# Patient Record
Sex: Male | Born: 1970 | Race: White | Hispanic: No | Marital: Married | Smoking: Never smoker
Health system: Southern US, Community
[De-identification: ages and names within clinical notes are randomized; demographics above are authoritative.]

## PROBLEM LIST (undated history)

## (undated) DIAGNOSIS — G4730 Sleep apnea, unspecified: Secondary | ICD-10-CM

## (undated) DIAGNOSIS — I10 Essential (primary) hypertension: Secondary | ICD-10-CM

## (undated) HISTORY — PX: ROTATOR CUFF REPAIR: SHX139

---

## 2006-07-04 ENCOUNTER — Ambulatory Visit: Payer: Self-pay | Admitting: Pulmonary Disease

## 2006-07-27 ENCOUNTER — Ambulatory Visit (HOSPITAL_BASED_OUTPATIENT_CLINIC_OR_DEPARTMENT_OTHER): Admission: RE | Admit: 2006-07-27 | Discharge: 2006-07-27 | Payer: Self-pay | Admitting: Pulmonary Disease

## 2006-08-02 ENCOUNTER — Ambulatory Visit: Payer: Self-pay | Admitting: Pulmonary Disease

## 2006-08-03 ENCOUNTER — Observation Stay (HOSPITAL_COMMUNITY): Admission: AD | Admit: 2006-08-03 | Discharge: 2006-08-04 | Payer: Self-pay | Admitting: Specialist

## 2007-12-13 ENCOUNTER — Ambulatory Visit (HOSPITAL_BASED_OUTPATIENT_CLINIC_OR_DEPARTMENT_OTHER): Admission: RE | Admit: 2007-12-13 | Discharge: 2007-12-13 | Payer: Self-pay | Admitting: Specialist

## 2010-10-26 NOTE — Op Note (Signed)
[name], [name]           ACCOUNT NO.:  [id number]   MEDICAL RECORD NO.:  [id number]          PATIENT TYPE:  AMB   LOCATION:  [facility]                         FACILITY:  [facility]   PHYSICIAN:  Erasmo Leventhal, M.D.DATE OF BIRTH:  11/05/1970   DATE OF PROCEDURE:  12/13/2007  DATE OF DISCHARGE:                               OPERATIVE REPORT   PREOPERATIVE DIAGNOSES:  1. 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  2. AC arthritis.   POSTOPERATIVE DIAGNOSES:  1. 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  2. AC arthritis.   PROCEDURE:  1. Left shoulder using glenohumeral diagnostic arthroscopy.  2. Arthroscopic subacromial decompression with acromioplasty,      bursectomy, and CA ligament release.  3. Arthroscopic distal clavicle resection,. Mumford procedure.  4. Arthroscopic rotator cuff repair.   SURGEON:  Erasmo Leventhal, M.D.   ASSISTANT:  Gary Bitter. [name], P.A.-C.   ANESTHESIA:  Interscalene block, general.   ESTIMATED BLOOD LOSS:  Less than 10 mL.   DRAINS:  None.   COMPLICATIONS:  None.   DISPOSITION:  PACU stable.   DETAILS:  The patient and family counseled in the holding area.  Taken  to the operating room and placed in position, general anesthesia, IV  antibiotics were given.  Turned to the right lateral decubitus position,  properly padded, bumped.  Left shoulder was examined.  Full range of  motion stable.  He was prepped with DuraPrep and draped in a sterile  fashion.  Overhead shoulder positioner was utilized at 30 degrees  abduction, 10 degrees of forward flexion, and 15 pounds of longitudinal  traction.  Posterior portal was created, and an arthroscope was placed  into the glenohumeral joint.  Diagnostic arthroscopy revealed a normal  glenohumeral anatomy except for a small rotator cuff tear, at the  supraspinatus insertion site.  Biceps labrum, and glenohumeral ligaments  noted in the capsule, and articular  cartilage was normal.   Arthroscope was placed in the subacromial region.  He was found to have  his rotator cuff tear, actually a small-to-medium sized tear around 1.5  cm.  The cuff was not significantly retracted, it was lightly mobilized.  Repair site was prepared down to bone.  The ArthroCare system was  utilized to introduce our lateral portal.  An ArthroCare system was  utilized to release the periosteum and the CA ligament.   A shaver was introduced into the subacromial bursectomy.  A burr was  then placed posteriorly, and an anterior-inferior acromioplasty was  performed converting to a flat acromion morphology.  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  The clavicle was palpated, and found to be stable.  Arthroscopic debris  was removed.  Hemostasis obtained.   Through a separate puncture wound, a bioabsorbable anchor was placed at  the appropriate level.  Mattress sutures were placed, tied down  sequentially, and these were then placed into a push-loc anchor for a  double row technique.  Now  had a well repaired rotator cuff, back to its  anatomic insertion site, cupping the rotator cuff footprint,  reestablishing the musculotendinous unit.  It was irrigated, and no  other abnormalities noted.  Arthroscope was removed, and taken out of  traction.  He had normal pulses in the hand at the end of the case.  Another gram of Ancef was given intravenously.  He was turned supine,  after closure of the wounds with nylon, he was placed in a sterile  dressing, placed into a shoulder sling, taken from the operating room to  PACU in stable condition.  There were no complications or problems.  Sponge and needle counts were correct.  Help with surgical technique and  decision making; Leilani Able  PA-C assistance was needed throughout the  entire case.           ______________________________  Erasmo Leventhal, M.D.     RAC/MEDQ  D:  12/13/2007  T:  12/13/2007  Job:  [id number]

## 2010-10-29 NOTE — Assessment & Plan Note (Signed)
[facility]                             PULMONARY OFFICE NOTE   NAME:Gary [name], Gary [name]                    MRN:          [id number]  DATE:07/04/2006                            DOB:          07-Feb-1971    SLEEP MEDICINE CONSULTATION   HISTORY OF PRESENT ILLNESS:  The patient is a 40 year old gentleman who  I have been asked to see for obstructive sleep apnea.  The patient has a  history of obstructive sleep apnea and is to undergo shoulder surgery.  The patient states that he was told that his sleep apnea was severe from  the past and underwent a CPAP titration study.  He got his machine and  had great difficulties with sleep onset and pulling off the mask during  the night.  He wore it for about 1 week and then discontinued.  The  patient states that he goes to bed between 10 and 11 and gets up between  4 and 5 to start his day.  He is not sure whether he is rested or not  upon arising.  He has been told that he has loud snoring but unsure if  he has pauses or not.  The patient notes sleep pressure during the day  with meetings and periods of inactivity.  He has occasional sleep  pressure with driving.  He may doze with TV or movies if they are not  very interesting.  He feels that his sleep problem is a little bit  better from what it has been in the past.  His weight is unchanged from  2 years ago.   PAST MEDICAL HISTORY:  Significant for:  1. Allergic rhinitis.  2. History of sleep apnea as stated above.  3. History of rotator cuff injury.   CURRENT MEDICATIONS:  None.   The patient INTOLERANCE TO CODEINE.   SOCIAL HISTORY:  He is married.  He works as an Secondary school teacher person.  He has never smoked.   FAMILY HISTORY:  Remarkable for his father having emphysema.   REVIEW OF SYSTEMS:  As per history of present illness.  Also see patient  intake form documented in the chart.   PHYSICAL EXAM:  GENERAL:  He is a mildly  overweight male in no acute  distress.  Blood pressure is 138/104.  Pulse 77.  Temperature is 98.6.  Weight is  178 pounds. O2 saturation on room air is 99%.  Five feet, 10 inches  tall.  HEENT:  Pupils equal round and reactive to light and accommodation.  Extraocular muscles are intact.  Nares shows deviated septum to the  right with near occlusion.  There are increased turbinates on the left.  Oropharynx does show elongation of soft palate and uvula.  NECK:  Supple without JVD or lymphadenopathy.  There is no palpable  thyromegaly.  CHEST:  Totally clear.  CARDIAC:  Regular rate and rhythm.  No murmurs, rubs, or gallops.  ABDOMEN:  Soft, nontender with good bowel sounds.  GENITAL, RECTAL, BREASTS:  Not done and not indicated.  Lower extremities are without edema. Good pulses distally.  There is no  calf tenderness.  NEUROLOGIC:  Alert and oriented with no obvious motor deficits.   IMPRESSION:  History of severe obstructive sleep apnea.  The patient  currently is on no treatment at this time.  Overall, he feels that he is  doing well but his weight has not changed.  He clearly is having daytime  symptomatology and I have explained to him that it is very unlikely that  the degree of his sleep apnea has changed very much without significant  weight loss.  I discussed with him the cardiovascular effects of sleep  apnea overtime and that we need to get him on appropriate treatment.   PLAN:  1. Work on weight loss.  2. We will go ahead and set the patient up from a split night study to      relook at his degree of sleep apnea and also to optimize his CPAP      pressure.  3. The patient will follow up after the above.     Barbaraann Share, MD,FCCP  Electronically Signed    [facility]/MedQ  DD: 08/08/2006  DT: 08/08/2006  Job #: [id number]   cc:   Erasmo Leventhal, M.D.

## 2010-10-29 NOTE — Procedures (Signed)
NAMEYESHUA, STRYKER           ACCOUNT NO.:  [id number]   MEDICAL RECORD NO.:  [id number]          PATIENT TYPE:  OUT   LOCATION:  [facility]                 FACILITY:  [facility]   PHYSICIAN:  Barbaraann Share, MD,FCCPDATE OF BIRTH:  03[birth year]16[birth year]1971   DATE OF STUDY:  07/27/2006                            NOCTURNAL POLYSOMNOGRAM   INDICATION FOR THE STUDY:  Hypersomnia with sleep apnea.   EPWORTH SCORE:  Ten.   SLEEP ARCHITECTURE:  The patient had a total sleep time of 367 minutes  with adequate slow wave sleep as well as REM.  Sleep onset latency was  normal as was REM onset.  Sleep efficiency was fairly good at 94%.   RESPIRATORY DATA:  The patient underwent split-night protocol where he  was found to have 59 obstructive events in the first 133 minutes of  sleep.  This gave the patient a respiratory disturbance index of 27  events per hour over that time period.  The events were more common in  the supine position, and there was snoring noted throughout.  The  patient was then placed on CPAP with a medium snapp mask.  CPAP pressure  was ultimately titrated to a final level of 9=cmH2O with excellent  control of both obstructive events and snoring.   OXYGEN DATA:  His O2 sat was as low as 84% with the patient's  obstructive events.   CARDIAC DATA:  No clinically significant cardiac arrhythmias.   MOVEMENT/PARASOMNIA:  None.   IMPRESSION/RECOMMENDATION:  Split-night study reveals moderate  obstructive sleep apnea with a respiratory disturbance index of 27  events per hour and oxygen desaturation as low as 84% during the first  half of the night.  The patient was then placed on a medium snapp mask  and ultimately titrated to 9-cmH2O with excellent control.      Barbaraann Share, MD,FCCP  Diplomate, American Board of Sleep  Medicine  Electronically Signed     [facility]/MEDQ  D:  08/03/2006 15:44:16  T:  08/03/2006 22:06:44  Job:  [id number]

## 2010-10-29 NOTE — Op Note (Signed)
NAMESANTE, BIEDERMANN           ACCOUNT NO.:  [id number]   MEDICAL RECORD NO.:  [id number]          PATIENT TYPE:  AMB   LOCATION:  [facility]                         FACILITY:  [facility]   PHYSICIAN:  Erasmo Leventhal, M.D.DATE OF BIRTH:  02-22-1971   DATE OF PROCEDURE:  08/03/2006  DATE OF DISCHARGE:                               OPERATIVE REPORT   PREOPERATIVE DIAGNOSES:  1. Right shoulder impingement syndrome.  2. Possible cuff tear.  3. Symptomatic degenerative acromioclavicular joint.   POSTOPERATIVE DIAGNOSES:  1. 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  2. Full-thickness rotator cuff tear.  3. Symptomatic degenerative acromioclavicular joint.   PROCEDURE:  1. Right shoulder exam under anesthesia.  2. Arthroscopic evaluation of the glenohumeral joint.  3. Arthroscopic subacromial decompression.  4. Arthroscopic distal clavicle resection.  5. Mumford procedure.  6. Arthroscopic rotator cuff repair.   SURGEON:  Erasmo Leventhal, M.D.   ASSISTANT:  Jaquelyn Bitter. Chabon, P.A.-C   ANESTHESIA:  Regional with general, interscalene block.   ESTIMATED BLOOD LOSS:  Less then 10 mL   DRAINS:  None.   COMPLICATIONS:  None.   DISPOSITION:  PACU stable.   OPERATIVE DETAILS:  The patient's family was counseled in the holding  area.  Correct side was identified.  IV had been started.  Antibiotics  were given and block administered.  He was then taken to the operating  room and placed in the supine position under general anesthesia.  He was  turned to the left lateral decubitus position, properly padded and  bumped.  Right shoulder had been examined, full range of motion, stable;  prepped with DuraPrep, and draped in a stable fashion.   Posterior portal was created and an arthroscope placed into the  glenohumeral joint.  Diagnostic arthroscopy revealed the following  findings.  Labrum and biceps intact.  Articular cartilage, glenohumeral  ligament is  normal.  There was a full-thickness, rotator cuff tear seen  at the supraspinatus insertion.  Arthroscope now placed into the  subacromial region where a subacromial bursectomy was performed from a  lateral portal.  The rotator cuff was found to be nonretracted.  The  rotator cuff repair site was repaired down to bleeding bone.  The  ArthroCare system was then utilized to release the periosteum and CA  ligament.  A bur was then placed posteriorly and an anterior/inferior  acromioplasty was performed converting to a type-1 acromion morphology.   The distal clavicle was found to be osteoarthritic and an accessory  anterior portal was made and the lateral 5-8 mm of the clavicle was  removed circumferentially leaving the superior and posterior  acromioclavicular ligaments and capsule intact and the clavicle was  palpated and found to be stable.  Arthroscopic debris was removed.  Another small puncture wound was made and Arthrex bioabsorbable anchor  was placed at the appropriate level.  Mattress sutures were placed, tied  down sequentially and then utilizing a push-lock anchor a double-row-  type technique, common suture, bridge technique was performed giving  excellent repair to the supraspinatus tendon to its anatomic insertion  site in a nice bleeding bed of  bone.  We had a nice sturdy repair,  irrigated, no other abnormalities noted.  The arthroscopic equipment was  removed.  He was taken out of traction and he had known pulses in the  hand at the end of the case.  The portals were closed with 4-0 nylon  suture.  Then 20 mL of 0.25% Marcaine with epinephrine placed in the  shoulder joint after this.  Sterile dressing was applied; turned supine,  and awakened.  Taken from the operating room in stable condition.  Another gram of Ancef was given intravenously postoperatively.  The  patient tolerated the procedure well.  There were no complications.  The  patient was taken from the operating  room in stable condition.   To help with surgical technique and decision making, Jaquelyn Bitter. Chabon,  P.A.-C's assistance was needed throughout the entire case.           ______________________________  Erasmo Leventhal, M.D.     RAC/MEDQ  D:  08/03/2006  T:  08/03/2006  Job:  [id number]

## 2011-03-10 LAB — POCT HEMOGLOBIN-HEMACUE: Hemoglobin: 16.3

## 2014-03-06 ENCOUNTER — Encounter (HOSPITAL_COMMUNITY): Payer: Self-pay | Admitting: Emergency Medicine

## 2014-03-06 ENCOUNTER — Emergency Department (HOSPITAL_COMMUNITY): Payer: BC Managed Care – PPO

## 2014-03-06 ENCOUNTER — Emergency Department (HOSPITAL_COMMUNITY)
Admission: EM | Admit: 2014-03-06 | Discharge: 2014-03-06 | Disposition: A | Discharge status: Home / Self Care | Payer: BC Managed Care – PPO | Attending: Emergency Medicine | Admitting: Emergency Medicine

## 2014-03-06 DIAGNOSIS — S61209A Unspecified open wound of unspecified finger without damage to nail, initial encounter: Secondary | ICD-10-CM | POA: Insufficient documentation

## 2014-03-06 DIAGNOSIS — Y9389 Activity, other specified: Secondary | ICD-10-CM | POA: Insufficient documentation

## 2014-03-06 DIAGNOSIS — S6990XA Unspecified injury of unspecified wrist, hand and finger(s), initial encounter: Secondary | ICD-10-CM | POA: Insufficient documentation

## 2014-03-06 DIAGNOSIS — S6980XA Other specified injuries of unspecified wrist, hand and finger(s), initial encounter: Secondary | ICD-10-CM | POA: Insufficient documentation

## 2014-03-06 DIAGNOSIS — W208XXA Other cause of strike by thrown, projected or falling object, initial encounter: Secondary | ICD-10-CM | POA: Insufficient documentation

## 2014-03-06 DIAGNOSIS — Y990 Civilian activity done for income or pay: Secondary | ICD-10-CM | POA: Insufficient documentation

## 2014-03-06 DIAGNOSIS — S61311A Laceration without foreign body of left index finger with damage to nail, initial encounter: Secondary | ICD-10-CM

## 2014-03-06 DIAGNOSIS — I10 Essential (primary) hypertension: Secondary | ICD-10-CM | POA: Diagnosis not present

## 2014-03-06 DIAGNOSIS — Y9289 Other specified places as the place of occurrence of the external cause: Secondary | ICD-10-CM | POA: Insufficient documentation

## 2014-03-06 DIAGNOSIS — Z79899 Other long term (current) drug therapy: Secondary | ICD-10-CM | POA: Insufficient documentation

## 2014-03-06 HISTORY — DX: Essential (primary) hypertension: I10

## 2014-03-06 MED ORDER — LIDOCAINE-EPINEPHRINE-TETRACAINE (LET) SOLUTION
3.0000 mL | Freq: Once | NASAL | Status: AC
  Administered 2014-03-06: 3 mL via TOPICAL
  Filled 2014-03-06: qty 3

## 2014-03-06 MED ORDER — IBUPROFEN 800 MG PO TABS: 800.0000 mg | ORAL_TABLET | Freq: Three times a day (TID) | ORAL | Status: DC

## 2014-03-06 MED ORDER — IBUPROFEN 400 MG PO TABS
800.0000 mg | ORAL_TABLET | Freq: Once | ORAL | Status: AC
  Administered 2014-03-06: 800 mg via ORAL
  Filled 2014-03-06: qty 2

## 2014-03-06 NOTE — ED Provider Notes (Signed)
CSN: [id number]     Arrival date & time 03/06/14  1211 History  This chart was scribed for non-physician practitioner, Fayrene Helper, PA-C working with Merrie Roof, MD by Greggory Stallion, ED scribe. This patient was seen in room TR07C/TR07C and the patient's care was started at 1:36 PM.   Chief Complaint  Patient presents with  . Finger Injury   The history is provided by the patient. No language interpreter was used.   HPI Comments: Gary [name] is a 43 y.o. male who presents to the Emergency Department complaining of left index finger injury that occurred at 11 AM today. States he was at work when a heavy pipe fell on it. Reports sudden onset pain with associated swelling and laceration. Denies numbness. Pt is right hand dominant. Pt thinks his last tetanus was is 2008. No numbness.  No other complaint  Past Medical History  Diagnosis Date  . Hypertension    Past Surgical History  Procedure Laterality Date  . Rotator cuff repair     No family history on file. History  Substance Use Topics  . Smoking status: Never Smoker   . Smokeless tobacco: Not on file  . Alcohol Use: Yes     Comment: occassionally    Review of Systems  Musculoskeletal: Positive for arthralgias and joint swelling.  Skin: Positive for wound.  Neurological: Negative for numbness.  All other systems reviewed and are negative.  Allergies  Codeine  Home Medications   Prior to Admission medications   Medication Sig Start Date End Date Taking? Authorizing Provider  BYSTOLIC 5 MG tablet Take 5 mg by mouth daily. 02/23/14  Yes Historical Provider, MD  omeprazole (PRILOSEC) 40 MG capsule Take 40 mg by mouth daily. 02/23/14  Yes Historical Provider, MD   BP 165/104  Pulse 76  Temp(Src) 98.2 F (36.8 C) (Oral)  Resp 18  Ht  (1.778 m)  Wt 197 lb (89.359 kg)  BMI 28.27 kg/m2  SpO2 97%  Physical Exam  Nursing note and vitals reviewed. Constitutional: He is oriented to person, place, and  time. He appears well-developed and well-nourished. No distress.  HENT:  Head: Normocephalic and atraumatic.  Eyes: Conjunctivae and EOM are normal.  Neck: Neck supple. No tracheal deviation present.  Cardiovascular: Normal rate.   Pulmonary/Chest: Effort normal. No respiratory distress.  Musculoskeletal: Normal range of motion. He exhibits edema.  Left index finger with an almost circumferential superficial laceration to proximal phalanx measuring about about 3 cm. Not actively bleeding. Edematous finger. No crepitus or obvious deformity. Capillary refill less than 3 seconds.   Neurological: He is alert and oriented to person, place, and time.  Skin: Skin is warm and dry.  Psychiatric: He has a normal mood and affect. His behavior is normal.    ED Course  Procedures (including critical care time)  LACERATION REPAIR PROCEDURE NOTE The patient's identification was confirmed and consent was obtained. This procedure was performed by Fayrene Helper, PA-C at 3:13 PM. Site: left index finger Sterile procedures observed Anesthetic used (type and amt): none Suture type/size: dermabond Length: 3 cm  # of Sutures: dermabond Technique: dermabond Complexity: simple Tetanus ordered Site anesthetized, irrigated with NS, explored without evidence of foreign body, wound well approximated, site covered with dry, sterile dressing.  Patient tolerated procedure well without complications. Instructions for care discussed verbally and patient provided with additional written instructions for homecare and f/u.   DIAGNOSTIC STUDIES: Oxygen Saturation is 97% on RA, normal by my interpretation.  COORDINATION OF CARE: 1:38 PM-Discussed treatment plan which includes updating tetanus and laceration repair with pt at bedside and pt agreed to plan.   Labs Review Labs Reviewed - No data to display  Imaging Review Dg Finger Index Left  03/06/2014   CLINICAL DATA:  Crush injury left index finger, pain.   EXAM: LEFT INDEX FINGER 2+V  COMPARISON:  None.  FINDINGS: Imaged bones, joints and soft tissues appear normal.  IMPRESSION: Negative examination.   Electronically Signed   By: Drusilla Kanner M.D.   On: 03/06/2014 13:19     EKG Interpretation None      MDM   Final diagnoses:  Laceration of left index finger w/o foreign body with damage to nail, initial encounter    BP 165/104  Pulse 76  Temp(Src) 98.2 F (36.8 C) (Oral)  Resp 18  Ht  (1.778 m)  Wt 197 lb (89.359 kg)  BMI 28.27 kg/m2  SpO2 97%  I have reviewed nursing notes and vital signs. I personally reviewed the imaging tests through PACS system  I reviewed available ER/hospitalization records thought the EMR   I personally performed the services described in this documentation, which was scribed in my presence. The recorded information has been reviewed and is accurate.  Fayrene Helper, PA-C 03/06/14 [phone]

## 2014-03-06 NOTE — ED Notes (Signed)
Heavy pipe fell on left index finger. 1 in lac to dorsal side of index finger.Bleeding controlled.

## 2014-03-06 NOTE — Discharge Instructions (Signed)
Stitches, Staples, or Skin Adhesive Strips  °Stitches (sutures), staples, and skin adhesive strips hold the skin together as it heals. They will usually be in place for 7 days or less. °HOME CARE °· Wash your hands with soap and water before and after you touch your wound. °· Only take medicine as told by your doctor. °· Cover your wound only if your doctor told you to. Otherwise, leave it open to air. °· Do not get your stitches wet or dirty. If they get dirty, dab them gently with a clean washcloth. Wet the washcloth with soapy water. Do not rub. Pat them dry gently. °· Do not put medicine or medicated cream on your stitches unless your doctor told you to. °· Do not take out your own stitches or staples. Skin adhesive strips will fall off by themselves. °· Do not pick at the wound. Picking can cause an infection. °· Do not miss your follow-up appointment. °· If you have problems or questions, call your doctor. °GET HELP RIGHT AWAY IF:  °· You have a temperature by mouth above 102° F (38.9° C), not controlled by medicine. °· You have chills. °· You have redness or pain around your stitches. °· There is puffiness (swelling) around your stitches. °· You notice fluid (drainage) from your stitches. °· There is a bad smell coming from your wound. °MAKE SURE YOU: °· Understand these instructions. °· Will watch your condition. °· Will get help if you are not doing well or get worse. °Document Released: 03/27/2009 Document Revised: 08/22/2011 Document Reviewed: 03/27/2009 °ExitCare® Patient Information ©2015 [employer]. This information is not intended to replace advice given to you by your health care provider. Make sure you discuss any questions you have with your health care provider. ° °

## 2014-03-06 NOTE — ED Notes (Signed)
Pt c/o finger injury to left hand pointer finger. Pt reports he was at work when a heavy pipe fell on it. Pt has full movement of finger. Obvious swelling and laceration seen to finger. Nad, skin warm and dry, resp e/u.

## 2014-03-07 NOTE — ED Provider Notes (Signed)
Medical screening examination/treatment/procedure(s) were performed by non-physician practitioner and as supervising physician I was immediately available for consultation/collaboration.   EKG Interpretation None        Candyce Churn III, MD 03/07/14 [phone]

## 2016-05-22 ENCOUNTER — Emergency Department (HOSPITAL_COMMUNITY): Payer: BLUE CROSS/BLUE SHIELD

## 2016-05-22 ENCOUNTER — Emergency Department (HOSPITAL_COMMUNITY)
Admission: EM | Admit: 2016-05-22 | Discharge: 2016-05-22 | Disposition: A | Discharge status: Home / Self Care | Payer: BLUE CROSS/BLUE SHIELD | Attending: Emergency Medicine | Admitting: Emergency Medicine

## 2016-05-22 ENCOUNTER — Encounter (HOSPITAL_COMMUNITY): Payer: Self-pay | Admitting: *Deleted

## 2016-05-22 DIAGNOSIS — R002 Palpitations: Secondary | ICD-10-CM | POA: Diagnosis present

## 2016-05-22 DIAGNOSIS — I480 Paroxysmal atrial fibrillation: Secondary | ICD-10-CM | POA: Insufficient documentation

## 2016-05-22 DIAGNOSIS — Z7901 Long term (current) use of anticoagulants: Secondary | ICD-10-CM | POA: Insufficient documentation

## 2016-05-22 DIAGNOSIS — I10 Essential (primary) hypertension: Secondary | ICD-10-CM | POA: Diagnosis not present

## 2016-05-22 DIAGNOSIS — Z79899 Other long term (current) drug therapy: Secondary | ICD-10-CM | POA: Diagnosis not present

## 2016-05-22 HISTORY — DX: Sleep apnea, unspecified: G47.30

## 2016-05-22 LAB — I-STAT CHEM 8, ED
BUN: 19 mg/dL (ref 6–20)
CALCIUM ION: 1.16 mmol/L (ref 1.15–1.40)
CHLORIDE: 106 mmol/L (ref 101–111)
Creatinine, Ser: 1 mg/dL (ref 0.61–1.24)
Glucose, Bld: 100 mg/dL — ABNORMAL HIGH (ref 65–99)
HCT: 48 % (ref 39.0–52.0)
HEMOGLOBIN: 16.3 g/dL (ref 13.0–17.0)
POTASSIUM: 3.5 mmol/L (ref 3.5–5.1)
SODIUM: 141 mmol/L (ref 135–145)
TCO2: 25 mmol/L (ref 0–100)

## 2016-05-22 LAB — I-STAT TROPONIN, ED: TROPONIN I, POC: 0 ng/mL (ref 0.00–0.08)

## 2016-05-22 LAB — PROTIME-INR
INR: 0.93
PROTHROMBIN TIME: 12.4 s (ref 11.4–15.2)

## 2016-05-22 MED ORDER — FLECAINIDE ACETATE 100 MG PO TABS
300.0000 mg | ORAL_TABLET | Freq: Once | ORAL | Status: AC
  Administered 2016-05-22: 300 mg via ORAL
  Filled 2016-05-22: qty 3

## 2016-05-22 MED ORDER — APIXABAN 5 MG PO TABS
5.0000 mg | ORAL_TABLET | Freq: Two times a day (BID) | ORAL | Status: DC
  Administered 2016-05-22: 5 mg via ORAL
  Filled 2016-05-22: qty 1

## 2016-05-22 MED ORDER — KETAMINE HCL-SODIUM CHLORIDE 100-0.9 MG/10ML-% IV SOSY
1.0000 mg/kg | PREFILLED_SYRINGE | Freq: Once | INTRAVENOUS | Status: DC
  Filled 2016-05-22: qty 10

## 2016-05-22 MED ORDER — APIXABAN 5 MG PO TABS: 5.0000 mg | ORAL_TABLET | Freq: Two times a day (BID) | ORAL | 0 refills | Status: AC

## 2016-05-22 NOTE — ED Notes (Signed)
Applied the [employer] pads to the pt, per Albin Fellingarla, Charity fundraiserN.

## 2016-05-22 NOTE — ED Triage Notes (Addendum)
Pt reports having palpitations today, feels like heart is racing and irregular rhythm. HR 120 at triage. Denies CP. No acute distress is noted at this time.

## 2016-05-22 NOTE — Discharge Instructions (Signed)
Please call the atrial fibrillation clinic at [phone] tomorrow for follow up appointment tomorrow

## 2016-05-22 NOTE — ED Provider Notes (Signed)
[facility]EM[facility]Y DEPT Provider Note   CSN: [id number] Arrival date & time: 05/22/16  1644     History   Chief Complaint Chief Complaint  Patient presents with  . Palpitations    HPI Gary [name] is a 45 y.o. male.  HPI  45 year old male who comes in today complaining of palpitations that began yesterday at noon. He is specific about the onset may have been constant since that time. He states he has had some palpitations intermittently that have been bleeding past. He feels slightly lightheaded but is otherwise well. He denies chest pain or dyspnea. No Previous history of similar symptoms except as noted above. He does have a history of high blood pressure and takes Bystolic.  Past Medical History:  Diagnosis Date  . Hypertension   . Sleep apnea     There are no active problems to display for this patient.   Past Surgical History:  Procedure Laterality Date  . ROTATOR CUFF REPAIR         Home Medications    Prior to Admission medications   Medication Sig Start Date End Date Taking? Authorizing Provider  ibuprofen (ADVIL,MOTRIN) 600 MG tablet Take 600 mg by mouth daily as needed (pain).   Yes Historical Provider, MD  nebivolol (BYSTOLIC) 5 MG tablet Take 5 mg by mouth daily.   Yes Historical Provider, MD  omeprazole (PRILOSEC) 40 MG capsule Take 40 mg by mouth daily. 02/23/14  Yes Historical Provider, MD  PRESCRIPTION MEDICATION Inhale into the lungs at bedtime. CPAP   Yes Historical Provider, MD  apixaban (ELIQUIS) 5 MG TABS tablet Take 1 tablet (5 mg total) by mouth 2 (two) times daily. 05/22/16   Margarita Grizzleanielle Leandra Vanderweele, MD  ibuprofen (ADVIL,MOTRIN) 800 MG tablet Take 1 tablet (800 mg total) by mouth 3 (three) times daily. Patient not taking: Reported on 05/22/2016 03/06/14   Fayrene HelperBowie Tran, PA-C    Family History History reviewed. No pertinent family history.  Social History Social History  Substance Use Topics  . Smoking status: Never Smoker  . Smokeless tobacco: Not  on file  . Alcohol use Yes     Comment: occassionally     Allergies   Codeine   Review of Systems Review of Systems  All other systems reviewed and are negative.    Physical Exam Updated Vital Signs BP (!) 143/114 (BP Location: Right Arm)   Pulse 105   Temp 98.3 F (36.8 C) (Oral)   Resp 18   Wt 89 kg   SpO2 99%   BMI 28.15 kg/m   Physical Exam  Constitutional: He is oriented to person, place, and time. He appears well-developed and well-nourished.  HENT:  Head: Normocephalic and atraumatic.  Right Ear: External ear normal.  Left Ear: External ear normal.  Eyes: Conjunctivae and EOM are normal. Pupils are equal, round, and reactive to light.  Neck: Normal range of motion.  Cardiovascular: An irregularly irregular rhythm present.  Pulmonary/Chest: Effort normal and breath sounds normal.  Abdominal: Soft. Bowel sounds are normal.  Musculoskeletal: Normal range of motion.  Neurological: He is alert and oriented to person, place, and time.  Skin: Skin is warm and dry.  Psychiatric: He has a normal mood and affect.  Nursing note and vitals reviewed.    ED Treatments / Results  Labs (all labs ordered are listed, but only abnormal results are displayed) Labs Reviewed  I-STAT CHEM 8, ED - Abnormal; Notable for the following:       Result Value  Glucose, Bld 100 (*)    All other components within normal limits  PROTIME-INR  Rosezena Sensor, ED  ED ECG REPORT   Date: 05/22/2016  Rate: 133  Rhythm: atrial fibrillation  QRS Axis: normal  Intervals: normal  ST/T Wave abnormalities: nonspecific ST changes  Conduction Disutrbances:none  Narrative Interpretation:   Old EKG Reviewed: none available  I have personally reviewed the EKG tracing and agree with the computerized printout as noted.  ED ECG REPORT   Date: 05/22/2016  Rate: 109  Rhythm: atrial fibrillation  QRS Axis: normal  Intervals: normal  ST/T Wave abnormalities: normal  Conduction  Disutrbances:rate has decreased  Narrative Interpretation:   Old EKG Reviewed: changes noted  I have personally reviewed the EKG tracing and agree with the computerized printout as noted.  Repeat EKG  EKG Interpretation  Date/Time:  Sunday May 22 2016 19:55:19 EST Ventricular Rate:  65 PR Interval:    QRS Duration: 100 QT Interval:  395 QTC Calculation: 411 R Axis:   -9 Text Interpretation:  Sinus rhythm Consider anterior infarct Confirmed by Antonio Creswell MD, Duwayne Heck [phone]) on 05/22/2016 8:57:45 PM       Radiology Dg Chest Port 1 View  Result Date: 05/22/2016 CLINICAL DATA:  Heart palpitations. EXAM: PORTABLE CHEST 1 VIEW COMPARISON:  None. FINDINGS: The heart size and mediastinal contours are within normal limits. Both lungs are clear. The visualized skeletal structures are unremarkable. IMPRESSION: No active disease. Electronically Signed   By: Tollie Eth M.D.   On: 05/22/2016 17:37    Procedures Procedures (including critical care time)  Medications Ordered in ED Medications  apixaban (ELIQUIS) tablet 5 mg (5 mg Oral Given 05/22/16 1824)  ketamine 100 mg in normal saline 10 mL (10mg /mL) syringe (not administered)  flecainide (TAMBOCOR) tablet 300 mg (300 mg Oral Given 05/22/16 1824)    This patients CHA2DS2-VASc Score and unadjusted Ischemic Stroke Rate (% per year) is equal to 0.6 % stroke rate/year from a score of 1  Above score calculated as 1 point each if present [CHF, HTN, DM, Vascular=MI/PAD/Aortic Plaque, Age if 65-74, or Male] Above score calculated as 2 points each if present [Age > 75, or Stroke/TIA/TE]  Initial Impression / Assessment and Plan / ED Course  I have reviewed the triage vital signs and the nursing notes.  Pertinent labs & imaging results that were available during my care of the patient were reviewed by me and considered in my medical decision making (see chart for details).  Clinical Course    Patient received flecainide  by mouth. He  also received eliquis 5 mg. Discussed with Dr. Ladona Ridgel. Land cardioversion in ED. Patient prepared labs returned normal. Just prior to cardioversion patient converted to normal sinus rhythm  CRITICAL CARE Performed by: Hilario Quarry Total critical care time: 45 minutes Critical care time was exclusive of separately billable procedures and treating other patients. Critical care was necessary to treat or prevent imminent or life-threatening deterioration. Critical care was time spent personally by me on the following activities: development of treatment plan with patient and/or surrogate as well as nursing, discussions with consultants, evaluation of patient's response to treatment, examination of patient, obtaining history from patient or surrogate, ordering and performing treatments and interventions, ordering and review of laboratory studies, ordering and review of radiographic studies, pulse oximetry and re-evaluation of patient's condition.  Final Clinical Impressions(s) / ED Diagnoses   Final diagnoses:  Paroxysmal atrial fibrillation (HCC)    New Prescriptions New Prescriptions   APIXABAN (ELIQUIS)  5 MG TABS TABLET    Take 1 tablet (5 mg total) by mouth 2 (two) times daily.     Margarita Grizzleanielle Alyiah Ulloa, MD 05/22/16 2102

## 2016-05-22 NOTE — ED Notes (Signed)
Patient cardioverted without assistance/medication. NSR on monitor. Repeat EKG performed.

## 2016-05-24 ENCOUNTER — Encounter (HOSPITAL_COMMUNITY): Payer: Self-pay | Admitting: Emergency Medicine

## 2016-05-24 ENCOUNTER — Emergency Department (HOSPITAL_COMMUNITY)
Admission: EM | Admit: 2016-05-24 | Discharge: 2016-05-24 | Disposition: A | Discharge status: Home / Self Care | Payer: BLUE CROSS/BLUE SHIELD | Attending: Emergency Medicine | Admitting: Emergency Medicine

## 2016-05-24 ENCOUNTER — Emergency Department (HOSPITAL_COMMUNITY): Payer: BLUE CROSS/BLUE SHIELD

## 2016-05-24 ENCOUNTER — Other Ambulatory Visit: Payer: Self-pay

## 2016-05-24 ENCOUNTER — Ambulatory Visit (HOSPITAL_BASED_OUTPATIENT_CLINIC_OR_DEPARTMENT_OTHER)
Admission: RE | Admit: 2016-05-24 | Discharge: 2016-05-24 | Disposition: A | Discharge status: Home / Self Care | Payer: BLUE CROSS/BLUE SHIELD | Source: Ambulatory Visit | Attending: Nurse Practitioner | Admitting: Nurse Practitioner

## 2016-05-24 ENCOUNTER — Encounter (HOSPITAL_COMMUNITY): Payer: Self-pay | Admitting: Nurse Practitioner

## 2016-05-24 VITALS — BP 156/100 | HR 69 | Ht 70.0 in | Wt 208.8 lb

## 2016-05-24 DIAGNOSIS — K219 Gastro-esophageal reflux disease without esophagitis: Secondary | ICD-10-CM | POA: Insufficient documentation

## 2016-05-24 DIAGNOSIS — Z7901 Long term (current) use of anticoagulants: Secondary | ICD-10-CM

## 2016-05-24 DIAGNOSIS — I480 Paroxysmal atrial fibrillation: Secondary | ICD-10-CM

## 2016-05-24 DIAGNOSIS — I10 Essential (primary) hypertension: Secondary | ICD-10-CM

## 2016-05-24 DIAGNOSIS — Z79899 Other long term (current) drug therapy: Secondary | ICD-10-CM | POA: Insufficient documentation

## 2016-05-24 DIAGNOSIS — R0789 Other chest pain: Secondary | ICD-10-CM | POA: Diagnosis not present

## 2016-05-24 DIAGNOSIS — G4730 Sleep apnea, unspecified: Secondary | ICD-10-CM | POA: Insufficient documentation

## 2016-05-24 DIAGNOSIS — I4891 Unspecified atrial fibrillation: Secondary | ICD-10-CM | POA: Diagnosis not present

## 2016-05-24 DIAGNOSIS — R9431 Abnormal electrocardiogram [ECG] [EKG]: Secondary | ICD-10-CM | POA: Insufficient documentation

## 2016-05-24 DIAGNOSIS — R079 Chest pain, unspecified: Secondary | ICD-10-CM | POA: Diagnosis present

## 2016-05-24 LAB — BASIC METABOLIC PANEL
ANION GAP: 7 (ref 5–15)
BUN: 18 mg/dL (ref 6–20)
CALCIUM: 9.5 mg/dL (ref 8.9–10.3)
CHLORIDE: 107 mmol/L (ref 101–111)
CO2: 24 mmol/L (ref 22–32)
Creatinine, Ser: 1.03 mg/dL (ref 0.61–1.24)
GFR calc non Af Amer: >60 mL/min (ref >60)
Glucose, Bld: 110 mg/dL — ABNORMAL HIGH (ref 65–99)
POTASSIUM: 3.9 mmol/L (ref 3.5–5.1)
Sodium: 138 mmol/L (ref 135–145)

## 2016-05-24 LAB — CBC
HEMATOCRIT: 45.3 % (ref 39.0–52.0)
HEMOGLOBIN: 16.1 g/dL (ref 13.0–17.0)
MCH: 31.3 pg (ref 26.0–34.0)
MCHC: 35.5 g/dL (ref 30.0–36.0)
MCV: 88.1 fL (ref 78.0–100.0)
Platelets: 297 10*3/uL (ref 150–400)
RBC: 5.14 MIL/uL (ref 4.22–5.81)
RDW: 11.9 % (ref 11.5–15.5)
WBC: 9.9 10*3/uL (ref 4.0–10.5)

## 2016-05-24 LAB — I-STAT TROPONIN, ED: TROPONIN I, POC: 0 ng/mL (ref 0.00–0.08)

## 2016-05-24 MED ORDER — DILTIAZEM HCL 30 MG PO TABS: ORAL_TABLET | ORAL | 3 refills | Status: AC

## 2016-05-24 NOTE — ED Provider Notes (Signed)
[facility] Provider Note   CSN: [id number] Arrival date & time: 05/24/16  1850     History   Chief Complaint Chief Complaint  Patient presents with  . Chest Pain    HPI Secundino GingerChristopher Siler is a 45 y.o. male.  45 yo M with a chief complaint of right sided chest pain. He felt that this was underneath his sternal border. With sharp and severe double them over lasted for about 15 minutes. Patient had a more mild version of this earlier in the day as well. Currently pain-free at this time. Is been able to eat and drink without difficulty. Denies any vomiting denies fevers denies diarrhea. Denies history of renal colic. Has a history of reflux disease and has been intermittently taking his medication. Patient recently had a electrocardioversion from atrial fibrillation.   The history is provided by the patient.  Chest Pain   This is a new problem. The current episode started 6 to 12 hours ago. The problem occurs rarely. The problem has been resolved. The pain is present in the lateral region. The pain is at a severity of 8/10. The pain is severe. The quality of the pain is described as sharp. The pain does not radiate. Duration of episode(s) is 15 minutes. Pertinent negatives include no abdominal pain, no fever, no headaches, no palpitations, no shortness of breath and no vomiting. He has tried nothing for the symptoms. The treatment provided no relief.    Past Medical History:  Diagnosis Date  . Hypertension   . Sleep apnea     There are no active problems to display for this patient.   Past Surgical History:  Procedure Laterality Date  . ROTATOR CUFF REPAIR         Home Medications    Prior to Admission medications   Medication Sig Start Date End Date Taking? Authorizing Provider  apixaban (ELIQUIS) 5 MG TABS tablet Take 1 tablet (5 mg total) by mouth 2 (two) times daily. 05/22/16   Margarita Grizzleanielle Ray, MD  diltiazem (CARDIZEM) 30 MG tablet Take 1 tablet every 4 hours AS  NEEDED for AFIB heart rate >100 as long as blood pressure >100. 05/24/16   Newman Niponna C Carroll, NP  ibuprofen (ADVIL,MOTRIN) 600 MG tablet Take 600 mg by mouth daily as needed (pain).    Historical Provider, MD  nebivolol (BYSTOLIC) 5 MG tablet Take 5 mg by mouth daily.    Historical Provider, MD  omeprazole (PRILOSEC) 40 MG capsule Take 40 mg by mouth daily. 02/23/14   Historical Provider, MD  PRESCRIPTION MEDICATION Inhale into the lungs at bedtime. CPAP    Historical Provider, MD    Family History No family history on file.  Social History Social History  Substance Use Topics  . Smoking status: Never Smoker  . Smokeless tobacco: Not on file  . Alcohol use Yes     Comment: occassionally     Allergies   Codeine   Review of Systems Review of Systems  Constitutional: Negative for chills and fever.  HENT: Negative for congestion and facial swelling.   Eyes: Negative for discharge and visual disturbance.  Respiratory: Negative for shortness of breath.   Cardiovascular: Positive for chest pain. Negative for palpitations.  Gastrointestinal: Negative for abdominal pain, diarrhea and vomiting.  Musculoskeletal: Negative for arthralgias and myalgias.  Skin: Negative for color change and rash.  Neurological: Negative for tremors, syncope and headaches.  Psychiatric/Behavioral: Negative for confusion and dysphoric mood.     Physical Exam Updated Vital  Signs BP 133/92   Pulse (!) 54   Temp 98 F (36.7 C) (Oral)   Resp 20   Ht 5\' 8"  (1.727 m)   Wt 208 lb (94.3 kg)   SpO2 96%   BMI 31.63 kg/m   Physical Exam  Constitutional: He is oriented to person, place, and time. He appears well-developed and well-nourished.  HENT:  Head: Normocephalic and atraumatic.  Eyes: EOM are normal. Pupils are equal, round, and reactive to light.  Neck: Normal range of motion. Neck supple. No JVD present.  Cardiovascular: Normal rate and regular rhythm.  Exam reveals no gallop and no friction rub.    No murmur heard. Pulmonary/Chest: No respiratory distress. He has no wheezes.  Abdominal: He exhibits no distension and no mass. There is no tenderness. There is no rebound and no guarding.  No noted pain in the right upper quadrant. Negative Murphy's.  Musculoskeletal: Normal range of motion.  Neurological: He is alert and oriented to person, place, and time.  Skin: No rash noted. No pallor.  No noted blue toes.   Psychiatric: He has a normal mood and affect. His behavior is normal.  Nursing note and vitals reviewed.    ED Treatments / Results  Labs (all labs ordered are listed, but only abnormal results are displayed) Labs Reviewed  BASIC METABOLIC PANEL - Abnormal; Notable for the following:       Result Value   Glucose, Bld 110 (*)    All other components within normal limits  CBC  I-STAT TROPOININ, ED    EKG  EKG Interpretation None       Radiology Dg Chest 2 View  Result Date: 05/24/2016 CLINICAL DATA:  Chest pain. EXAM: CHEST  2 VIEW COMPARISON:  Radiographs of May 22, 2016. FINDINGS: The heart size and mediastinal contours are within normal limits. Both lungs are clear. No pneumothorax or pleural effusion is noted. The visualized skeletal structures are unremarkable. IMPRESSION: No active cardiopulmonary disease. Electronically Signed   By: Lupita RaiderJames  Green Jr, M.D.   On: 05/24/2016 20:03    Procedures Procedures (including critical care time)  Medications Ordered in ED Medications - No data to display   Initial Impression / Assessment and Plan / ED Course  I have reviewed the triage vital signs and the nursing notes.  Pertinent labs & imaging results that were available during my care of the patient were reviewed by me and considered in my medical decision making (see chart for details).  Clinical Course     45 yo M With a chief complaint of episodic abdominal pain. Not reproducible on exam. Unsure if this is reflux versus biliary colic. Doubt embolis  from afib conversion.  Very short in duration. We'll have him start Zantac. PCP follow-up.   I have discussed the diagnosis/risks/treatment options with the patient and family and believe the pt to be eligible for discharge home to follow-up with PCP. We also discussed returning to the ED immediately if new or worsening sx occur. We discussed the sx which are most concerning (e.g., sudden worsening pain, fever, inability to tolerate by mouth) that necessitate immediate return. Medications administered to the patient during their visit and any new prescriptions provided to the patient are listed below.  Medications given during this visit Medications - No data to display   The patient appears reasonably screen and/or stabilized for discharge and I doubt any other medical condition or other [facility] requiring further screening, evaluation, or treatment in the ED at this  time prior to discharge.    Final Clinical Impressions(s) / ED Diagnoses   Final diagnoses:  Atypical chest pain    New Prescriptions Discharge Medication List as of 05/24/2016  9:46 PM       Melene Plan, DO 05/25/16 0001

## 2016-05-24 NOTE — ED Triage Notes (Signed)
Pt reports having sharp intermittent pains in the right side of his chest that's started around 1600 today. Pt states he was here in the ed for afib on Sunday and converted with medications and today followed up in th ea fib clinic and "ebverything checked out" pt had pain once he was home and came to ed. Pt denies any chest pain or sob at this time. Pt is warm dry and in NSR on ekg.

## 2016-05-24 NOTE — Discharge Instructions (Signed)
Try zantac 150mg twice a day.  ° ° °

## 2016-05-24 NOTE — Patient Instructions (Signed)
Cardizem 30mg -- take 1 tablet every 4 hours AS NEEDED for AFIB heart rate >100 as long as blood pressure >100.    

## 2016-05-25 NOTE — Progress Notes (Signed)
Primary Care Physician: Maximiano CossHUNGARLAND,JOHN DAVID, MD Referring Physician:   Secundino GingerChristopher [name] is a 45 y.o. male in the atrial fibrillation clinic for evaluation. He has a h/o sleep apnea, HTN, reflux and had new onset afib and presented to the ER 12/10 for palpitations  He was diagnosed with new onset afib. He was given flecainide by mouth and was prepared for cardioversion but returned to SR while getting prepared. He was sent home on eliquis 5 mg bid.  In the office today, he denies any further issues with heart rhythm. He does have sleep apnea and wife reports that the pt uses intermittently. He does consume large amounts of caffeine. Two - 7 beers a week. No regular exercise routine. No previous echo or stress test. He denies exertional dyspnea or chest pain. He was already on BB prior to afib onset, bystolic for HTN. BP today not optimally controlled. He states that PCP has struggled with his control and has tried many drugs in the past.  Today, he denies symptoms of palpitations, chest pain, shortness of breath, orthopnea, PND, lower extremity edema, dizziness, presyncope, syncope, or neurologic sequela. The patient is tolerating medications without difficulties and is otherwise without complaint today.   Past Medical History:  Diagnosis Date  . Hypertension   . Sleep apnea    Past Surgical History:  Procedure Laterality Date  . ROTATOR CUFF REPAIR      Current Outpatient Prescriptions  Medication Sig Dispense Refill  . apixaban (ELIQUIS) 5 MG TABS tablet Take 1 tablet (5 mg total) by mouth 2 (two) times daily. 60 tablet 0  . ibuprofen (ADVIL,MOTRIN) 600 MG tablet Take 600 mg by mouth daily as needed (pain).    . nebivolol (BYSTOLIC) 5 MG tablet Take 5 mg by mouth daily.    Marland Kitchen. omeprazole (PRILOSEC) 40 MG capsule Take 40 mg by mouth daily.    Marland Kitchen. PRESCRIPTION MEDICATION Inhale into the lungs at bedtime. CPAP    . diltiazem (CARDIZEM) 30 MG tablet Take 1 tablet every 4 hours AS NEEDED  for AFIB heart rate >100 as long as blood pressure >100. 45 tablet 3   No current facility-administered medications for this encounter.     Allergies  Allergen Reactions  . Codeine Rash    Social History   Social History  . Marital status: Married    Spouse name: N/A  . Number of children: N/A  . Years of education: N/A   Occupational History  . Not on file.   Social History Main Topics  . Smoking status: Never Smoker  . Smokeless tobacco: Not on file  . Alcohol use Yes     Comment: occassionally  . Drug use: No  . Sexual activity: Not on file   Other Topics Concern  . Not on file   Social History Narrative  . No narrative on file    No family history on file.  ROS- All systems are reviewed and negative except as per the HPI above  Physical Exam: Vitals:   05/24/16 1453  BP: (!) 156/100  Pulse: 69  Weight: 208 lb 12.8 oz (94.7 kg)  Height: 5\' 10"  (1.778 m)   Wt Readings from Last 3 Encounters:  05/24/16 208 lb (94.3 kg)  05/24/16 208 lb 12.8 oz (94.7 kg)  05/22/16 196 lb 3.4 oz (89 kg)    Labs: Lab Results  Component Value Date   NA 138 05/24/2016   K 3.9 05/24/2016   CL 107 05/24/2016   CO2 24  05/24/2016   GLUCOSE 110 (H) 05/24/2016   BUN 18 05/24/2016   CREATININE 1.03 05/24/2016   CALCIUM 9.5 05/24/2016   Lab Results  Component Value Date   INR 0.93 05/22/2016   No results found for: CHOL, HDL, LDLCALC, TRIG   GEN- The patient is well appearing, alert and oriented x 3 today.   Head- normocephalic, atraumatic Eyes-  Sclera clear, conjunctiva pink Ears- hearing intact Oropharynx- clear Neck- supple, no JVP Lymph- no cervical lymphadenopathy Lungs- Clear to ausculation bilaterally, normal work of breathing Heart- Regular rate and rhythm, no murmurs, rubs or gallops, PMI not laterally displaced GI- soft, NT, ND, + BS Extremities- no clubbing, cyanosis, or edema MS- no significant deformity or atrophy Skin- no rash or lesion Psych-  euthymic mood, full affect Neuro- strength and sensation are intact  EKG-NSR at 69 bpm, cannot r/o anterior infarct, age undetermined, Pr int 138 ms, qrs int 84 ms, qtc 415 ms Epic records reviewed    Assessment and Plan: 1. New onset afib Continue apixaban 5 mg for a chadsvasc score of 1 for HTN x 30 days and then stop Continue BB,  Bystolic, which will help encourage SR Will rx 30 mg cardizem as needed if repeat episodes of afib Echo  2.Lifestyle issues contributing to afib Wear CPAP regularly Limit alcohol to no more than 2 drinks a week Reduce use of caffeine Encouraged regular exercise and modest weight loss   3. HTN Appears not optimally controlled  Return to PCP for better management as poorly controlled HTN can trigger afib  Will discuss echo with pt when results are known and will then determine f/u May also need stress test in future  Lupita LeashDonna C. Matthew Folksarroll, ANP-C [facility] [address] [place], [place] [address] [phone]

## 2016-05-27 ENCOUNTER — Ambulatory Visit (HOSPITAL_COMMUNITY)
Admission: RE | Admit: 2016-05-27 | Discharge: 2016-05-27 | Disposition: A | Discharge status: Home / Self Care | Payer: BLUE CROSS/BLUE SHIELD | Source: Ambulatory Visit | Attending: Nurse Practitioner | Admitting: Nurse Practitioner

## 2016-05-27 DIAGNOSIS — I4891 Unspecified atrial fibrillation: Secondary | ICD-10-CM | POA: Diagnosis present

## 2016-05-27 DIAGNOSIS — I10 Essential (primary) hypertension: Secondary | ICD-10-CM | POA: Insufficient documentation

## 2016-05-27 DIAGNOSIS — I480 Paroxysmal atrial fibrillation: Secondary | ICD-10-CM | POA: Insufficient documentation

## 2016-05-27 NOTE — Progress Notes (Signed)
  Echocardiogram 2D Echocardiogram has been performed.  Marisue Humblelexis N Mykira Hofmeister 05/27/2016, 1:30 PM

## 2016-08-24 ENCOUNTER — Encounter: Payer: Self-pay | Admitting: Internal Medicine

## 2016-08-31 ENCOUNTER — Ambulatory Visit: Payer: BLUE CROSS/BLUE SHIELD | Admitting: Cardiovascular Disease

## 2016-09-02 ENCOUNTER — Ambulatory Visit: Payer: BLUE CROSS/BLUE SHIELD | Admitting: Internal Medicine

## 2016-09-05 ENCOUNTER — Encounter: Payer: Self-pay | Admitting: Internal Medicine

## 2018-07-13 IMAGING — DX DG CHEST 2V
2 series · 2 of 2 positions shown · non-contrast
Comparison: Radiographs May 22, 2016.

CLINICAL DATA: Chest pain.

EXAM:
CHEST  2 VIEW

[chest pa]
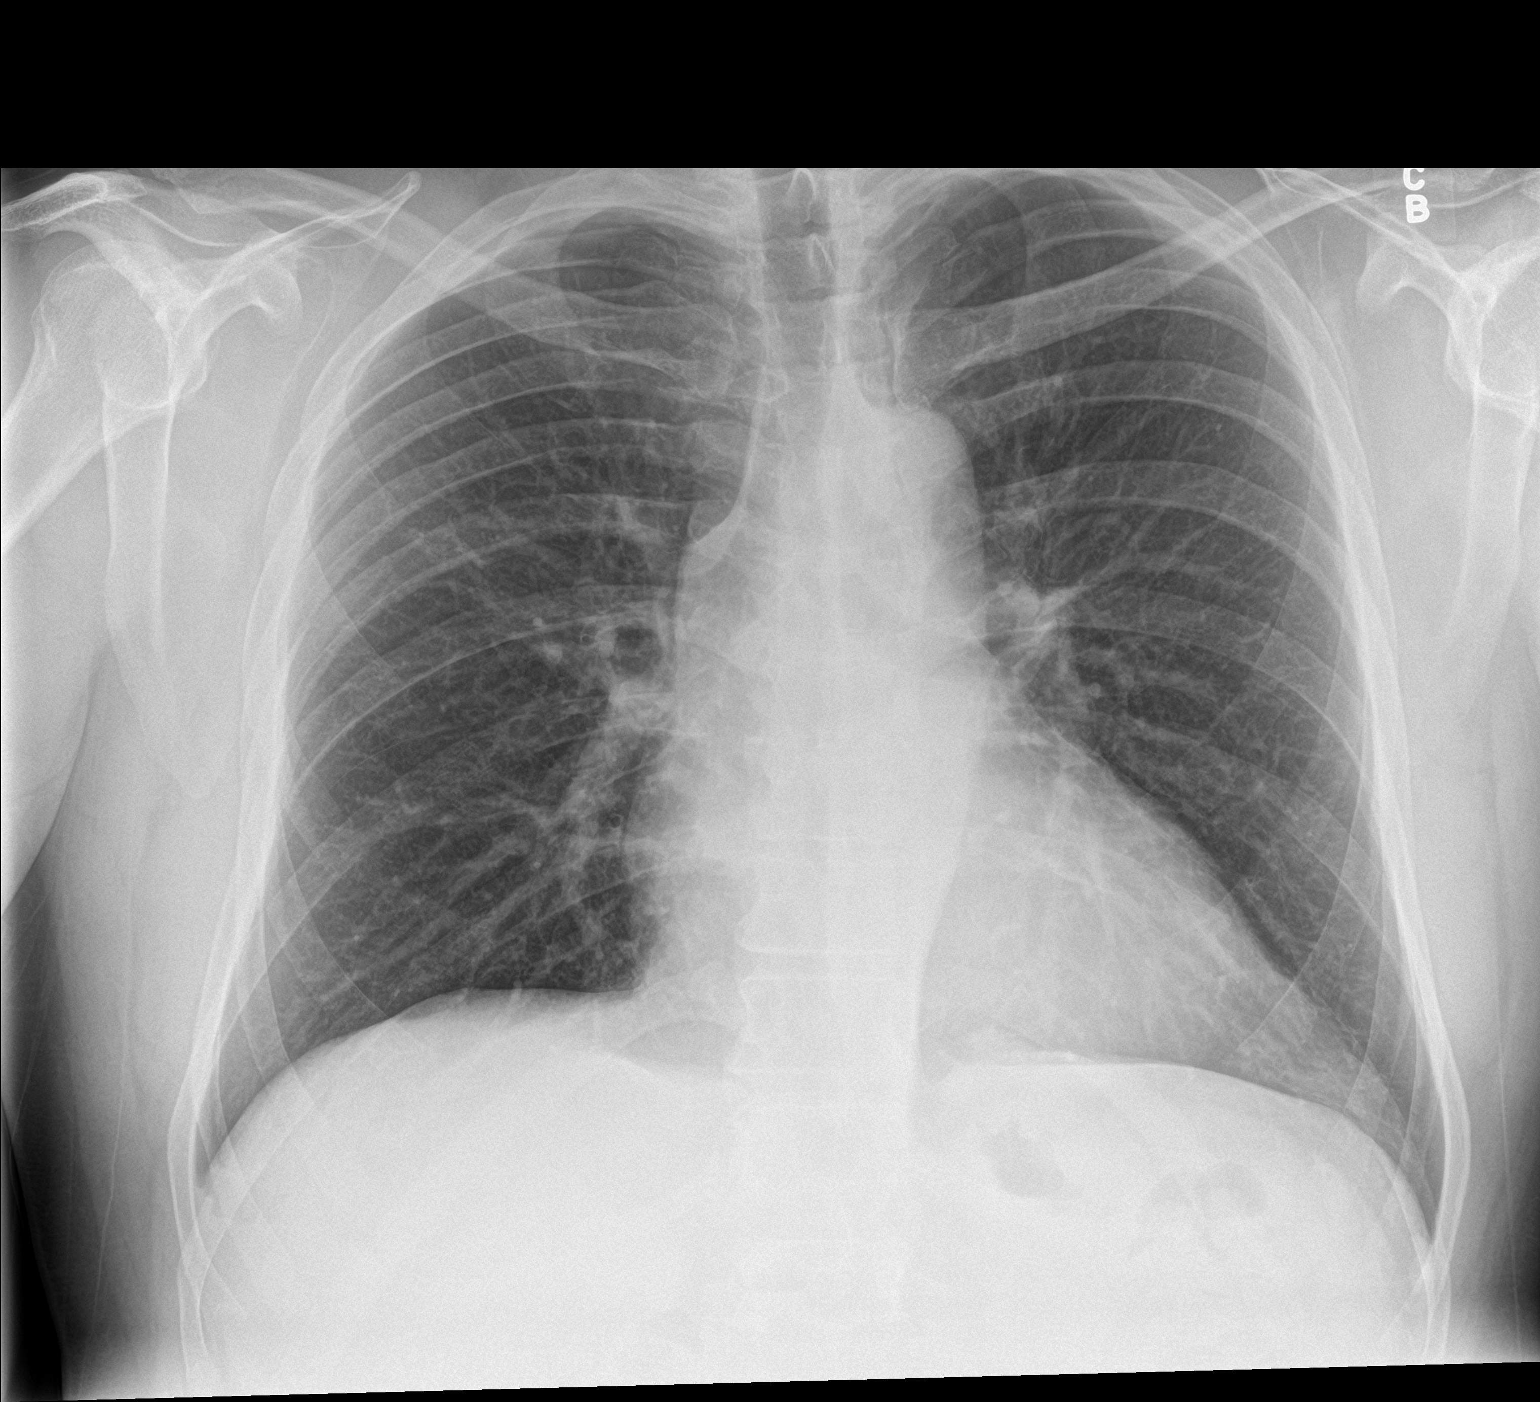

[chest lat]
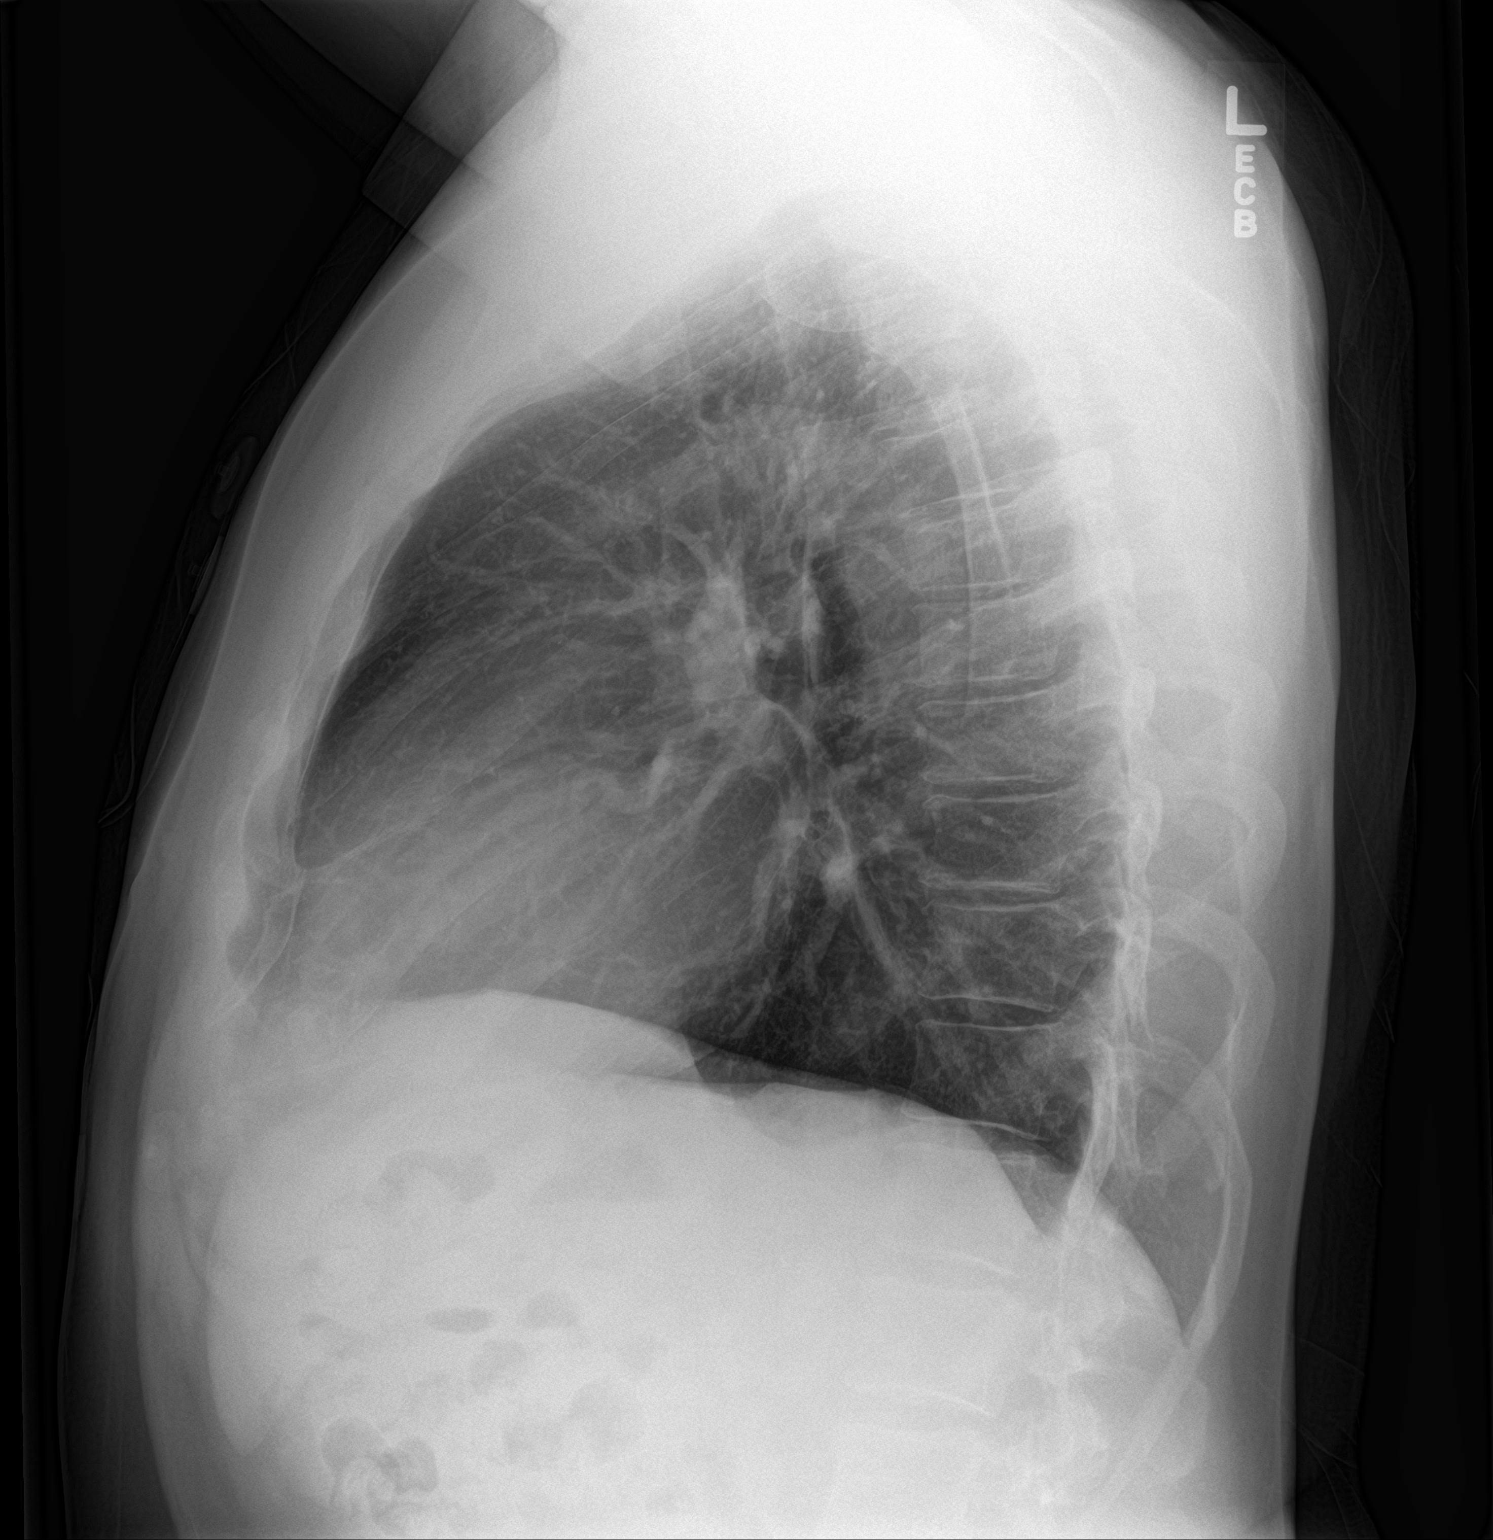

[2 of 2 positions shown; findings below may reference images not displayed]

FINDINGS: The heart size and mediastinal contours are within normal limits.
Both lungs are clear. No pneumothorax or pleural effusion is noted.
The visualized skeletal structures are unremarkable.
IMPRESSION: No active cardiopulmonary disease.

## 2019-12-13 ENCOUNTER — Other Ambulatory Visit (HOSPITAL_COMMUNITY): Payer: Self-pay | Admitting: Specialist

## 2019-12-13 ENCOUNTER — Ambulatory Visit (HOSPITAL_COMMUNITY)
Admission: RE | Admit: 2019-12-13 | Discharge: 2019-12-13 | Disposition: A | Discharge status: Home / Self Care | Payer: BC Managed Care – PPO | Source: Ambulatory Visit | Attending: Cardiology | Admitting: Cardiology

## 2019-12-13 ENCOUNTER — Other Ambulatory Visit (HOSPITAL_COMMUNITY): Payer: Self-pay | Admitting: Family Medicine

## 2019-12-13 ENCOUNTER — Inpatient Hospital Stay (HOSPITAL_COMMUNITY): Admission: RE | Admit: 2019-12-13 | Payer: BLUE CROSS/BLUE SHIELD | Source: Ambulatory Visit

## 2019-12-13 ENCOUNTER — Other Ambulatory Visit: Payer: Self-pay

## 2019-12-13 DIAGNOSIS — M79662 Pain in left lower leg: Secondary | ICD-10-CM | POA: Insufficient documentation

## 2019-12-13 DIAGNOSIS — M7989 Other specified soft tissue disorders: Secondary | ICD-10-CM | POA: Insufficient documentation

## 2019-12-13 DIAGNOSIS — M79605 Pain in left leg: Secondary | ICD-10-CM
